# Patient Record
Sex: Male | Born: 1955 | Race: Asian | Hispanic: No | Marital: Married | Smoking: Never smoker
Health system: Southern US, Community
[De-identification: ages and names within clinical notes are randomized; demographics above are authoritative.]

## PROBLEM LIST (undated history)

## (undated) DIAGNOSIS — E079 Disorder of thyroid, unspecified: Secondary | ICD-10-CM

## (undated) HISTORY — PX: BLADDER SURGERY: SHX569

---

## 2019-11-21 ENCOUNTER — Emergency Department (HOSPITAL_BASED_OUTPATIENT_CLINIC_OR_DEPARTMENT_OTHER)
Admission: EM | Admit: 2019-11-21 | Discharge: 2019-11-21 | Disposition: A | Discharge status: Home / Self Care | Payer: PRIVATE HEALTH INSURANCE | Attending: Emergency Medicine | Admitting: Emergency Medicine

## 2019-11-21 ENCOUNTER — Other Ambulatory Visit: Payer: Self-pay

## 2019-11-21 ENCOUNTER — Encounter (HOSPITAL_BASED_OUTPATIENT_CLINIC_OR_DEPARTMENT_OTHER): Payer: Self-pay | Admitting: Emergency Medicine

## 2019-11-21 ENCOUNTER — Emergency Department (HOSPITAL_BASED_OUTPATIENT_CLINIC_OR_DEPARTMENT_OTHER): Payer: Self-pay

## 2019-11-21 DIAGNOSIS — R079 Chest pain, unspecified: Secondary | ICD-10-CM | POA: Insufficient documentation

## 2019-11-21 DIAGNOSIS — Z20822 Contact with and (suspected) exposure to covid-19: Secondary | ICD-10-CM | POA: Insufficient documentation

## 2019-11-21 HISTORY — DX: Disorder of thyroid, unspecified: E07.9

## 2019-11-21 LAB — D-DIMER, QUANTITATIVE: D-Dimer, Quant: <0.27 ug/mL-FEU (ref 0.00–0.50)

## 2019-11-21 LAB — CBC WITH DIFFERENTIAL/PLATELET
Abs Immature Granulocytes: 0.01 10*3/uL (ref 0.00–0.07)
Basophils Absolute: 0 10*3/uL (ref 0.0–0.1)
Basophils Relative: 1 %
Eosinophils Absolute: 0.1 10*3/uL (ref 0.0–0.5)
Eosinophils Relative: 1 %
HCT: 43.1 % (ref 39.0–52.0)
Hemoglobin: 14.5 g/dL (ref 13.0–17.0)
Immature Granulocytes: 0 %
Lymphocytes Relative: 25 %
Lymphs Abs: 1.2 10*3/uL (ref 0.7–4.0)
MCH: 29.1 pg (ref 26.0–34.0)
MCHC: 33.6 g/dL (ref 30.0–36.0)
MCV: 86.5 fL (ref 80.0–100.0)
Monocytes Absolute: 0.5 10*3/uL (ref 0.1–1.0)
Monocytes Relative: 10 %
Neutro Abs: 3 10*3/uL (ref 1.7–7.7)
Neutrophils Relative %: 63 %
Platelets: 205 10*3/uL (ref 150–400)
RBC: 4.98 MIL/uL (ref 4.22–5.81)
RDW: 13.1 % (ref 11.5–15.5)
WBC: 4.8 10*3/uL (ref 4.0–10.5)
nRBC: 0 % (ref 0.0–0.2)

## 2019-11-21 LAB — COMPREHENSIVE METABOLIC PANEL
ALT: 25 U/L (ref 0–44)
AST: 33 U/L (ref 15–41)
Albumin: 4.3 g/dL (ref 3.5–5.0)
Alkaline Phosphatase: 63 U/L (ref 38–126)
Anion gap: 10 (ref 5–15)
BUN: 7 mg/dL — ABNORMAL LOW (ref 8–23)
CO2: 25 mmol/L (ref 22–32)
Calcium: 9.1 mg/dL (ref 8.9–10.3)
Chloride: 100 mmol/L (ref 98–111)
Creatinine, Ser: 1.02 mg/dL (ref 0.61–1.24)
GFR calc Af Amer: >60 mL/min (ref >60)
GFR calc non Af Amer: >60 mL/min (ref >60)
Glucose, Bld: 92 mg/dL (ref 70–99)
Potassium: 3.9 mmol/L (ref 3.5–5.1)
Sodium: 135 mmol/L (ref 135–145)
Total Bilirubin: 0.9 mg/dL (ref 0.3–1.2)
Total Protein: 7.5 g/dL (ref 6.5–8.1)

## 2019-11-21 LAB — TROPONIN I (HIGH SENSITIVITY)
Troponin I (High Sensitivity): 2 ng/L (ref <18)
Troponin I (High Sensitivity): 3 ng/L (ref <18)

## 2019-11-21 LAB — SARS CORONAVIRUS 2 BY RT PCR (HOSPITAL ORDER, PERFORMED IN ~~LOC~~ HOSPITAL LAB): SARS Coronavirus 2: NEGATIVE

## 2019-11-21 NOTE — ED Provider Notes (Signed)
[facility] EMERGENCY DEPARTMENT Provider Note   CSN: [id number] Arrival date & time: 11/21/19  0175     History Chief Complaint  Patient presents with  . Shortness of Breath    Jeremy Decker is a 64 y.o. male.  HPI 64 year old male presents with chest pain.  Son at the bedside helps translate.  Ongoing for about 4 days.  Was more intermittent when it first started but now is more constant since yesterday.  It is a sharp pain.  Mostly to the right of his chest and then goes to the left.  Also feels short of breath and the pain is pleuritic.  No cough, fever, or leg swelling.  Pain is about a 7 out of 10.  He has not taken anything for pain.  Has a history of thyroid disease but denies any other significant past medical history including no smoking.  Moved here from [place] about 4 months ago, otherwise no recent travel.   Past Medical History:  Diagnosis Date  . Thyroid disease     There are no problems to display for this patient.   Past Surgical History:  Procedure Laterality Date  . BLADDER SURGERY         No family history on file.  Social History   Tobacco Use  . Smoking status: Never Smoker  . Smokeless tobacco: Never Used  Substance Use Topics  . Alcohol use: Never  . Drug use: Never    Home Medications Prior to Admission medications   Not on File    Allergies    Patient has no known allergies.  Review of Systems   Review of Systems  Constitutional: Negative for fever.  Respiratory: Positive for shortness of breath. Negative for cough.   Cardiovascular: Positive for chest pain. Negative for leg swelling.  Musculoskeletal: Negative for back pain.  All other systems reviewed and are negative.   Physical Exam Updated Vital Signs BP 114/76   Pulse 71   Temp 98.4 F (36.9 C) (Oral)   Resp 10   Ht 5\' 9"  (1.753 m)   Wt 66.6 kg   SpO2 100%   BMI 21.68 kg/m   Physical Exam Vitals and nursing note reviewed.  Constitutional:    General: He is not in acute distress.    Appearance: He is well-developed. He is not ill-appearing or diaphoretic.  HENT:     Head: Normocephalic and atraumatic.     Right Ear: External ear normal.     Left Ear: External ear normal.     Nose: Nose normal.  Eyes:     General:        Right eye: No discharge.        Left eye: No discharge.  Cardiovascular:     Rate and Rhythm: Normal rate and regular rhythm.     Heart sounds: Normal heart sounds.  Pulmonary:     Effort: Pulmonary effort is normal.     Breath sounds: Normal breath sounds.  Chest:     Chest wall: No tenderness.  Abdominal:     Palpations: Abdomen is soft.     Tenderness: There is no abdominal tenderness.  Musculoskeletal:     Cervical back: Neck supple.     Right lower leg: No edema.     Left lower leg: No edema.  Skin:    General: Skin is warm and dry.  Neurological:     Mental Status: He is alert.  Psychiatric:  Mood and Affect: Mood is not anxious.     ED Results / Procedures / Treatments   Labs (all labs ordered are listed, but only abnormal results are displayed) Labs Reviewed  COMPREHENSIVE METABOLIC PANEL - Abnormal; Notable for the following components:      Result Value   BUN 7 (*)    All other components within normal limits  SARS CORONAVIRUS 2 BY RT PCR (HOSPITAL ORDER, [facility])  CBC WITH DIFFERENTIAL/PLATELET  D-DIMER, QUANTITATIVE (NOT AT [facility])  TROPONIN I (HIGH SENSITIVITY)  TROPONIN I (HIGH SENSITIVITY)    EKG EKG Interpretation  Date/Time:  Wednesday Nov 21 2019 09:51:45 EDT Ventricular Rate:  81 PR Interval:    QRS Duration: 96 QT Interval:  388 QTC Calculation: 451 R Axis:   81 Text Interpretation: Sinus rhythm Consider right atrial enlargement Borderline right axis deviation No old tracing to compare Confirmed by Sherwood Gambler [phone]) on 11/21/2019 9:54:08 AM   Radiology DG Chest 2 View  Result Date: 11/21/2019 CLINICAL DATA:  Chest  pain. EXAM: CHEST - 2 VIEW COMPARISON:  None. FINDINGS: The heart size and mediastinal contours are within normal limits. Both lungs are clear. No pneumothorax or pleural effusion is noted. The visualized skeletal structures are unremarkable. IMPRESSION: No active cardiopulmonary disease. Electronically Signed   By: Marijo Conception M.D.   On: 11/21/2019 10:25    Procedures Procedures (including critical care time)  Medications Ordered in ED Medications - No data to display  ED Course  I have reviewed the triage vital signs and the nursing notes.  Pertinent labs & imaging results that were available during my care of the patient were reviewed by me and considered in my medical decision making (see chart for details).    MDM Rules/Calculators/A&P                      Unclear cause of the patient's chest pain.  It is certainly atypical starting on the right side and then going left.  ECG is without ischemia.  Chest x-ray has been personally reviewed as well as the labs which are overall unremarkable.  This includes troponins negative x2.  Otherwise, the patient is not ill-appearing and repeatedly declines pain medicine.  He is low risk for PE and has a negative D-dimer.  Advised ibuprofen as needed as this may be a chest wall issue.  He otherwise appears stable for discharge home. Final Clinical Impression(s) / ED Diagnoses Final diagnoses:  Nonspecific chest pain    Rx / DC Orders ED Discharge Orders    None       Sherwood Gambler, MD 11/21/19 1356

## 2019-11-21 NOTE — ED Triage Notes (Addendum)
SOB and pain in right chest that radiates to left chest. Pain worse with deep breath.  No cough.  Went to [facility] 4 days ago and they told him to come to the ED then but they did not.  Pain and SOB getting worse.  Pt visiting from [place] x4 months.  Speaks Telegu.  Adult son at bedside to translate.

## 2019-11-21 NOTE — ED Notes (Signed)
ED Provider at bedside. 

## 2019-11-21 NOTE — Discharge Instructions (Addendum)
If you develop recurrent, continued, or worsening chest pain, shortness of breath, fever, vomiting, abdominal or back pain, or any other new/concerning symptoms then return to the ER for evaluation.  

## 2021-08-17 IMAGING — CR DG CHEST 2V
2 series · 2 of 2 positions shown · non-contrast
Comparison: None.

CLINICAL DATA: Chest pain.

EXAM:
CHEST - 2 VIEW

[w chest pa]
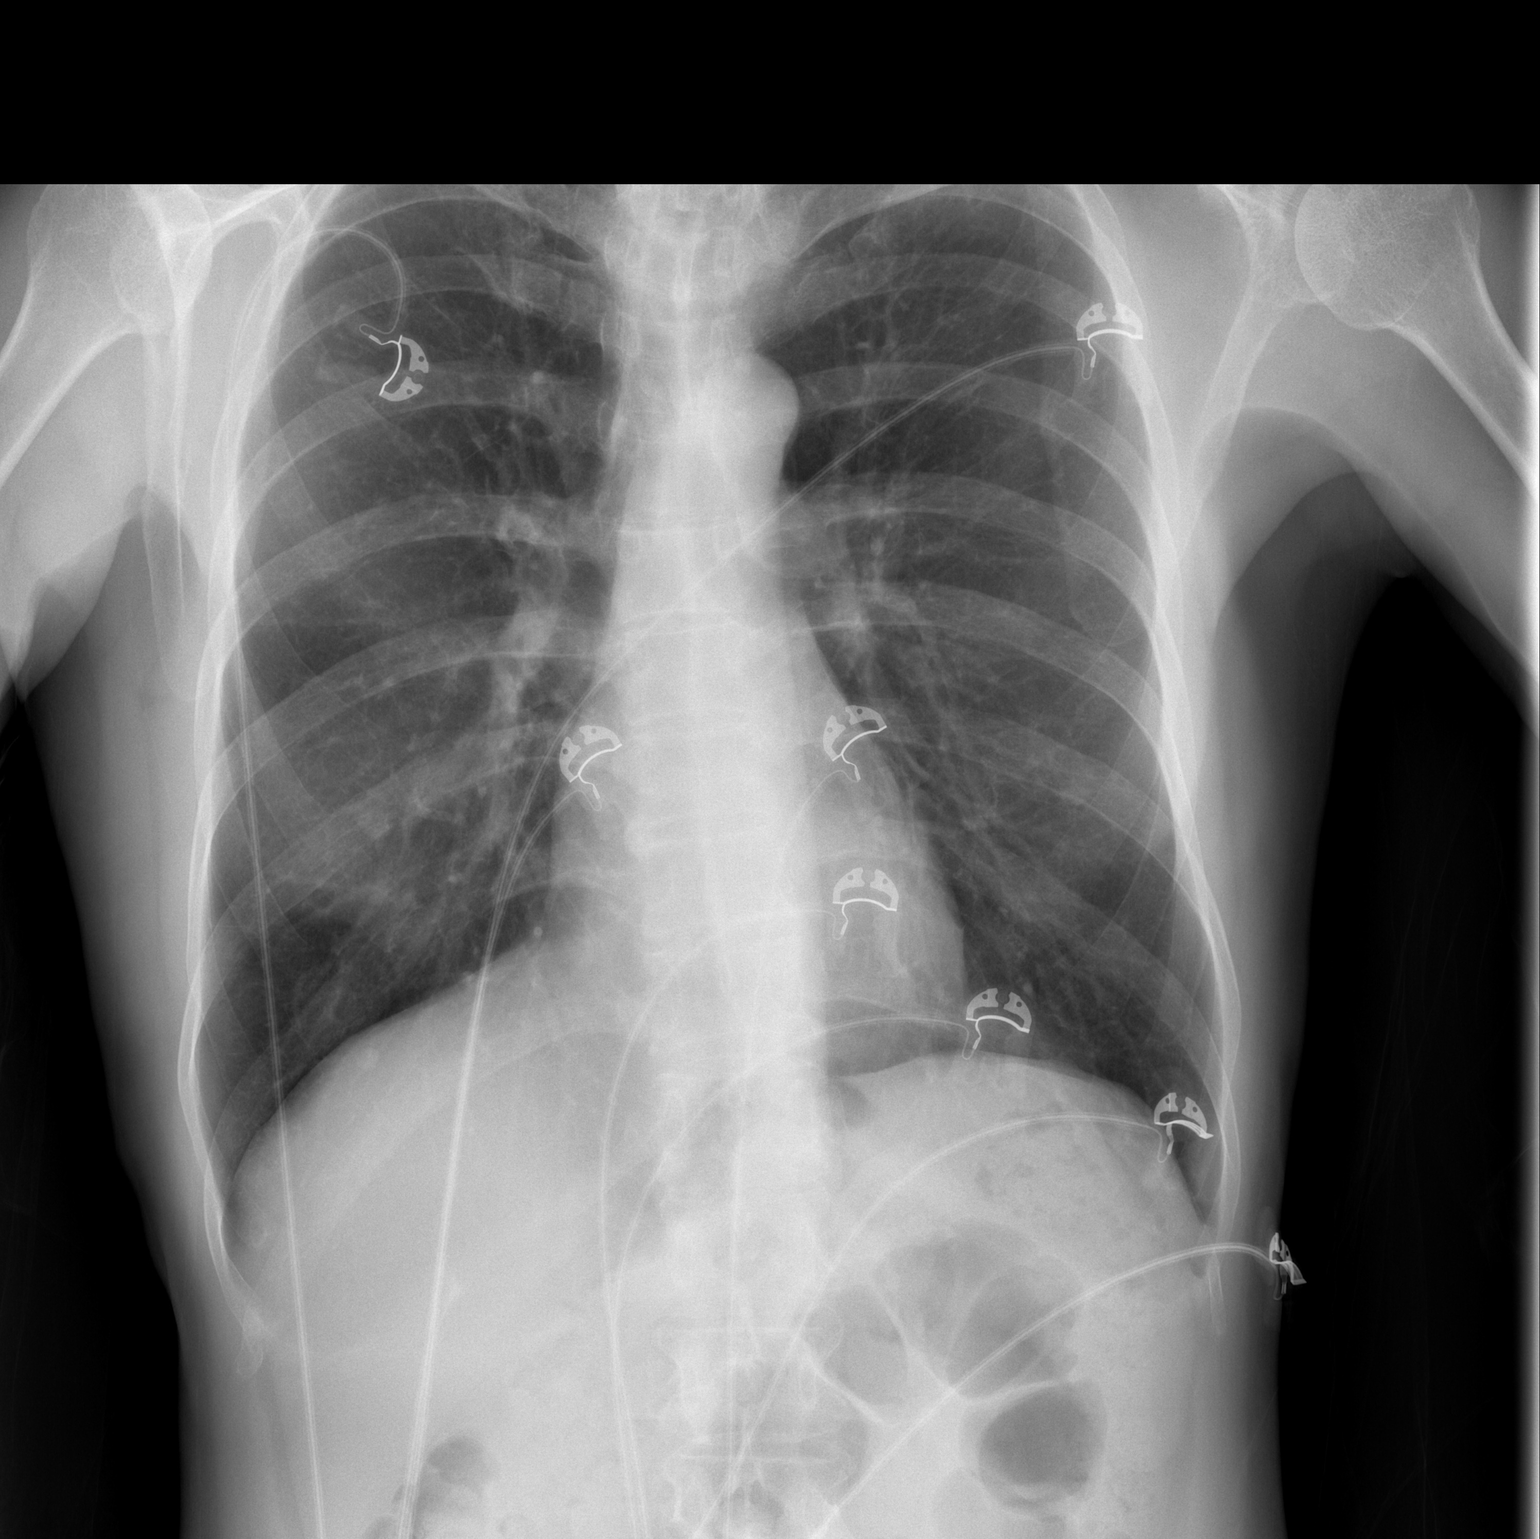

[w chest lat]
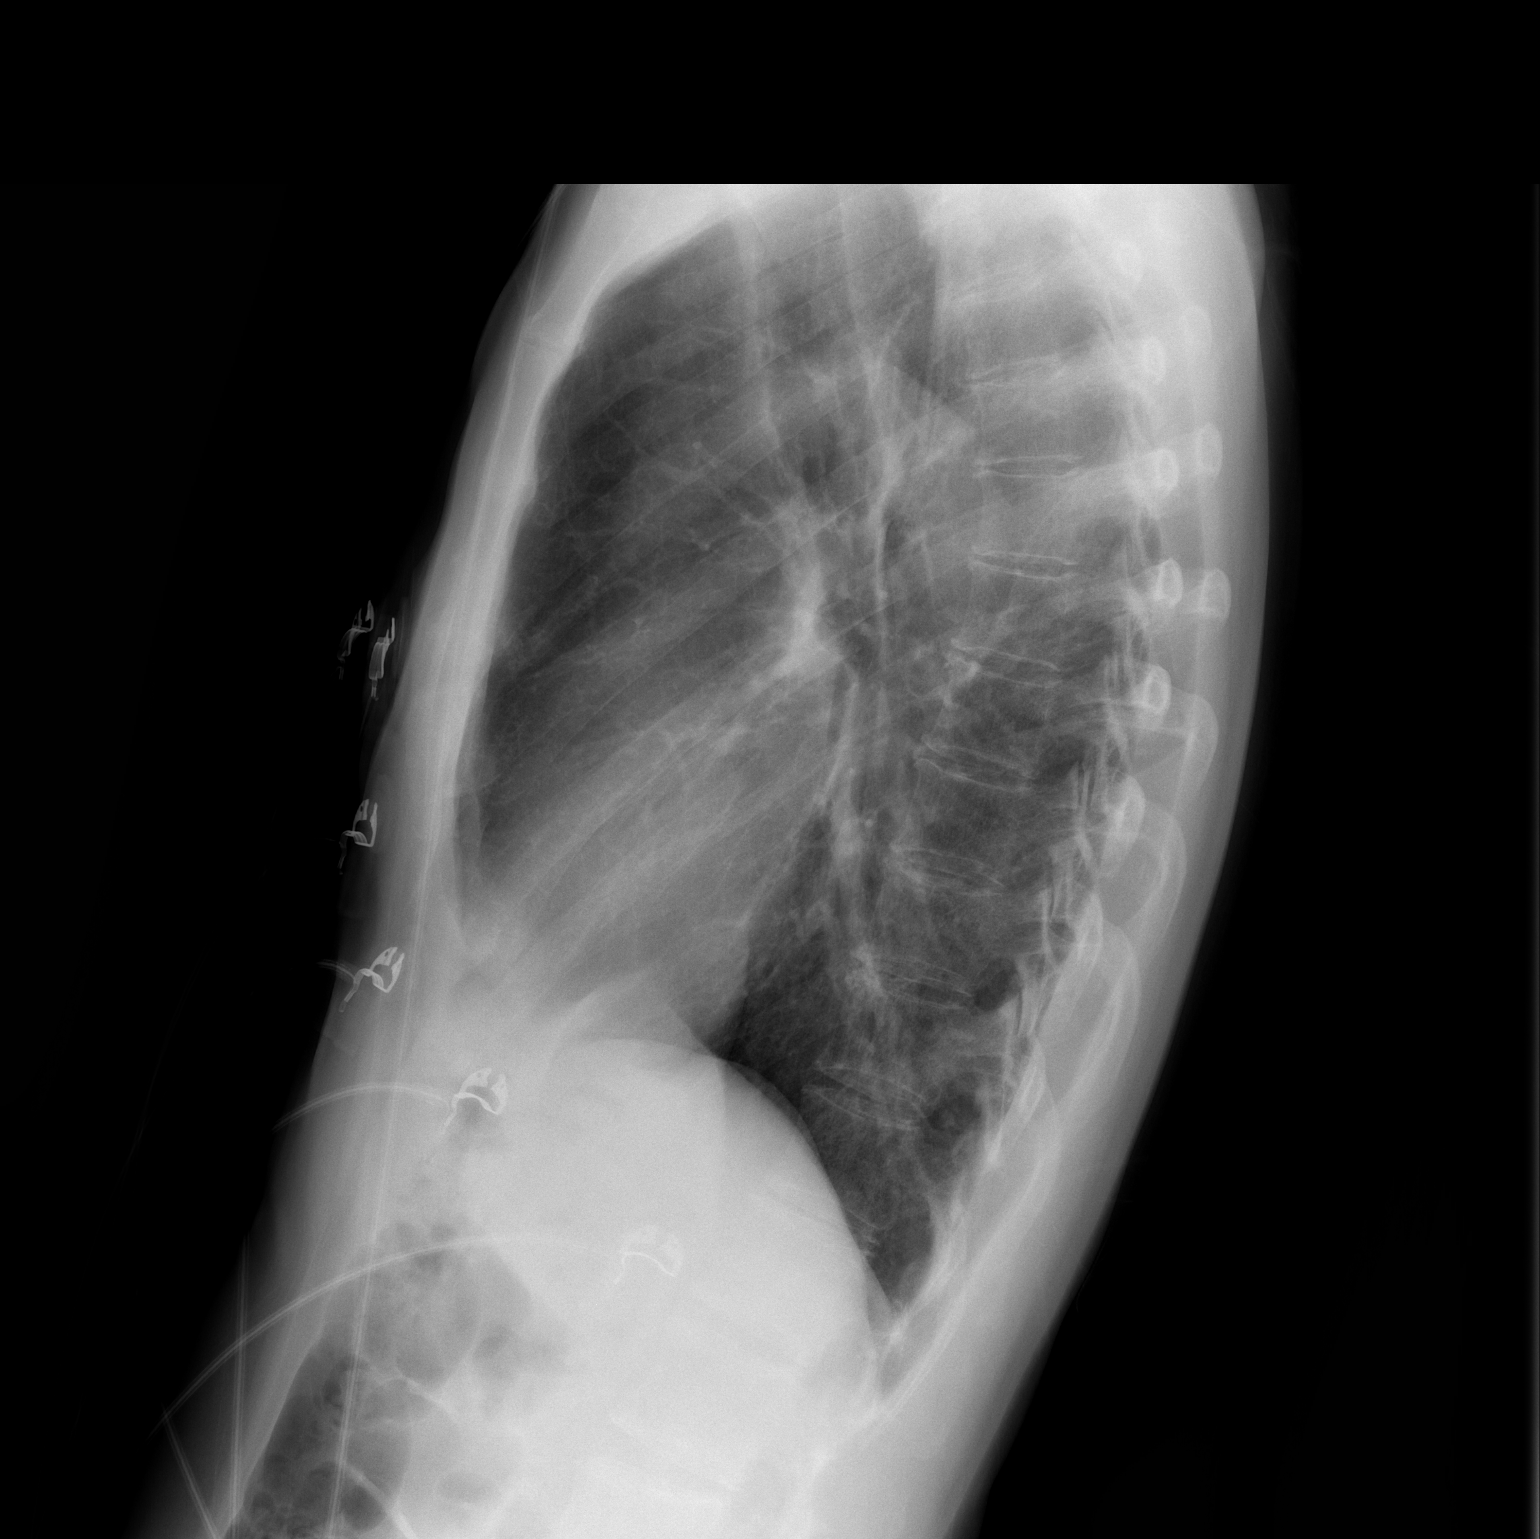

[2 of 2 positions shown; findings below may reference images not displayed]

FINDINGS: The heart size and mediastinal contours are within normal limits.
Both lungs are clear. No pneumothorax or pleural effusion is noted.
The visualized skeletal structures are unremarkable.
IMPRESSION: No active cardiopulmonary disease.
